# Patient Record
Sex: Female | Born: 1965 | Race: Black or African American | Hispanic: No | Marital: Single | Smoking: Never smoker
Health system: Southern US, Community
[De-identification: ages and names within clinical notes are randomized; demographics above are authoritative.]

## PROBLEM LIST (undated history)

## (undated) DIAGNOSIS — S069XAA Unspecified intracranial injury with loss of consciousness status unknown, initial encounter: Secondary | ICD-10-CM

## (undated) DIAGNOSIS — S069X9A Unspecified intracranial injury with loss of consciousness of unspecified duration, initial encounter: Secondary | ICD-10-CM

---

## 2019-11-27 ENCOUNTER — Ambulatory Visit
Admission: EM | Admit: 2019-11-27 | Discharge: 2019-11-27 | Disposition: A | Discharge status: Home / Self Care | Attending: Family Medicine | Admitting: Family Medicine

## 2019-11-27 ENCOUNTER — Other Ambulatory Visit: Payer: Self-pay

## 2019-11-27 ENCOUNTER — Ambulatory Visit (INDEPENDENT_AMBULATORY_CARE_PROVIDER_SITE_OTHER)

## 2019-11-27 ENCOUNTER — Encounter: Payer: Self-pay | Admitting: Emergency Medicine

## 2019-11-27 DIAGNOSIS — M545 Low back pain: Secondary | ICD-10-CM | POA: Diagnosis not present

## 2019-11-27 DIAGNOSIS — S39012A Strain of muscle, fascia and tendon of lower back, initial encounter: Secondary | ICD-10-CM

## 2019-11-27 DIAGNOSIS — M546 Pain in thoracic spine: Secondary | ICD-10-CM

## 2019-11-27 DIAGNOSIS — S161XXA Strain of muscle, fascia and tendon at neck level, initial encounter: Secondary | ICD-10-CM

## 2019-11-27 DIAGNOSIS — M542 Cervicalgia: Secondary | ICD-10-CM | POA: Diagnosis not present

## 2019-11-27 HISTORY — DX: Unspecified intracranial injury with loss of consciousness of unspecified duration, initial encounter: S06.9X9A

## 2019-11-27 HISTORY — DX: Unspecified intracranial injury with loss of consciousness status unknown, initial encounter: S06.9XAA

## 2019-11-27 MED ORDER — CYCLOBENZAPRINE HCL 10 MG PO TABS: 10.0000 mg | ORAL_TABLET | Freq: Every day | ORAL | 0 refills | Status: DC
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
## 2019-11-27 NOTE — Discharge Instructions (Signed)
Rest, ice/heat, over the counter anti-inflammatories/tylenol as needed
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
## 2020-04-24 ENCOUNTER — Other Ambulatory Visit: Payer: Self-pay

## 2020-04-24 ENCOUNTER — Other Ambulatory Visit: Payer: Self-pay | Admitting: Radiology

## 2020-04-24 DIAGNOSIS — Z20822 Contact with and (suspected) exposure to covid-19: Secondary | ICD-10-CM

## 2020-04-26 LAB — SARS-COV-2, NAA 2 DAY TAT

## 2020-04-26 LAB — NOVEL CORONAVIRUS, NAA: SARS-CoV-2, NAA: NOT DETECTED

## 2021-01-09 ENCOUNTER — Emergency Department
Admission: EM | Admit: 2021-01-09 | Discharge: 2021-01-09 | Disposition: A | Discharge status: Home / Self Care | Payer: Worker's Compensation | Attending: Emergency Medicine | Admitting: Emergency Medicine

## 2021-01-09 ENCOUNTER — Emergency Department: Payer: Worker's Compensation

## 2021-01-09 ENCOUNTER — Other Ambulatory Visit: Payer: Self-pay

## 2021-01-09 DIAGNOSIS — Y92410 Unspecified street and highway as the place of occurrence of the external cause: Secondary | ICD-10-CM | POA: Diagnosis not present

## 2021-01-09 DIAGNOSIS — S161XXA Strain of muscle, fascia and tendon at neck level, initial encounter: Secondary | ICD-10-CM | POA: Diagnosis not present

## 2021-01-09 DIAGNOSIS — R519 Headache, unspecified: Secondary | ICD-10-CM | POA: Insufficient documentation

## 2021-01-09 DIAGNOSIS — R42 Dizziness and giddiness: Secondary | ICD-10-CM | POA: Insufficient documentation

## 2021-01-09 DIAGNOSIS — S199XXA Unspecified injury of neck, initial encounter: Secondary | ICD-10-CM | POA: Diagnosis present

## 2021-01-09 MED ORDER — BACLOFEN 10 MG PO TABS: 10.0000 mg | ORAL_TABLET | Freq: Three times a day (TID) | ORAL | 0 refills | Status: AC

## 2021-01-09 MED ORDER — MELOXICAM 15 MG PO TABS: 15.0000 mg | ORAL_TABLET | Freq: Every day | ORAL | 2 refills | Status: AC
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
## 2021-01-09 NOTE — Discharge Instructions (Addendum)
Apply ice to all areas that hurt.  Return emergency department worsening.  The CT of your head and C-spine are both negative.  Your chest x-ray did not show any abnormalities. Take the meloxicam and baclofen as directed.  If the baclofen makes you drowsy you could cut it in half.  Apply ice to all areas that hurt.  Follow-up with orthopedics if you are not improving in 5 to 7 days.
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
